# Patient Record
Sex: Male | Born: 2005 | Race: Black or African American | Hispanic: No | Marital: Single | Smoking: Never smoker
Health system: Southern US, Community
[De-identification: ages and names within clinical notes are randomized; demographics above are authoritative.]

## PROBLEM LIST (undated history)

## (undated) HISTORY — PX: MYRINGOTOMY WITH TUBE PLACEMENT: SHX5663

---
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
## 2024-04-05 NOTE — Progress Notes (Signed)
 " [address] - AMBULATORY [facility] [address] [place] [address]   Date of Service: 04/05/2024 Patient Name: Isaac Blankenship Patient DOB: Dec 27, 2005   Well Adolescent Visit Subjective:  Isaac Blankenship is a 18 y.o. male who presents for his routine well visit. Chief Complaint  Patient presents with   Well Child    Pt presents for 41yr WCC. No concerns    Well Child Assessment: History provided by: Patient. Isaac Blankenship lives with his mother, father, brother and sister.  Nutrition Types of intake include cereals, cow's milk, eggs, fish, fruits, meats, vegetables and juices.  Dental The patient has a dental home. The patient brushes teeth regularly. The patient flosses regularly. Last dental exam was less than 6 months ago.  Elimination Elimination problems do not include constipation.  Sleep Average sleep duration is 7 hours. The patient does not snore. There are no sleep problems.  Safety There is no smoking in the home. Home has working smoke alarms? yes. Home has working carbon monoxide alarms? yes. There is no gun in home.  School Current grade level is 12th. Current school district is [employer]. There are no signs of learning disabilities. Child is doing well in school.  Social After school, the child is at home with a parent. Sibling interactions are good. The child spends 5 hours in front of a screen (tv or computer) per day.    Tobacco Use: No tobacco use   Substance Use: No previous substance abuse history.  Sexual History: The patient denies current or previous sexual activity.  Behavior/Mood: Depression Plan: Normal/Negative Screening  Comments: Doing well Film and photography interest; [employer] 5 year program will graduate next year with an Associates degree   Past medical history, family history, medications, allergies reviewed and reconciled as  appropriate. Objective:  BP 120/79 (BP Location: Left arm, Patient Position: Sitting)   Pulse 71   Temp 98 F (36.7 C) (Oral)   Resp 18   Ht 1.638 m (5' 4.5)   Wt 51.9 kg (114 lb 8 oz)   BMI 19.35 kg/m   Physical Exam Vitals reviewed.  Constitutional:      General: He is not in acute distress.    Appearance: Normal appearance. He is well-developed.  HENT:     Head: Atraumatic.     Right Ear: Tympanic membrane, ear canal and external ear normal.     Left Ear: Tympanic membrane, ear canal and external ear normal.     Nose: Nose normal.     Mouth/Throat:     Mouth: Mucous membranes are moist.     Pharynx: Oropharynx is clear.  Eyes:     Extraocular Movements: Extraocular movements intact.     Conjunctiva/sclera: Conjunctivae normal.     Pupils: Pupils are equal, round, and reactive to light.  Cardiovascular:     Rate and Rhythm: Normal rate and regular rhythm.     Pulses: Normal pulses.     Heart sounds: Normal heart sounds.  Pulmonary:     Effort: Pulmonary effort is normal.     Breath sounds: Normal breath sounds.  Abdominal:     General: Abdomen is flat. Bowel sounds are normal. There is no distension.     Palpations: Abdomen is soft.     Tenderness: There is no abdominal tenderness.  Genitourinary:    Penis: Normal.      Testes: Normal.     Comments:  Circ, no hernia palpated, Tanner IV Musculoskeletal:        General: Normal range of motion.     Cervical back: Normal range of motion and neck supple.     Comments: No spinal curvature appreciated.  Skin:    Capillary Refill: Capillary refill takes less than 2 seconds.  Neurological:     General: No focal deficit present.     Mental Status: He is alert and oriented to person, place, and time.     Sensory: Sensation is intact.     Motor: Motor function is intact.     Gait: Gait is intact.  Psychiatric:        Mood and Affect: Mood and affect normal.        Speech: Speech normal.        Behavior: Behavior  normal. Behavior is cooperative.    Hearing Screening   500Hz  1000Hz  2000Hz  4000Hz   Right ear 20 20 20 20   Left ear 20 20 20 20    Vision Screening   Right eye Left eye Both eyes  Without correction 20/16 20/16   With correction        No results found for this visit on 04/05/24.  PHQ Depression Screening: Most recent PHQ-9 result: Patient Health Questionnaire-9 Score: 2 (04/05/24 1039) PHQ-9 Question # 9 Thoughts that you would be better off dead or hurting yourself in some way: Not at all (04/05/24 1039) Interpretation: PHQ-2 Interpretation: Negative (None-minimal Depression Severity) (04/05/24 1039) PHQ-9 Interpretation: Negative (None-minimal Depression Severity) (04/05/24 1039)  CRAFFT Screening: CRAFFT Screening Tool for Adolescent Substance Abuse Part A:  During the PAST 12 MONTHS, on how many days did you: 1. Drink more than a few sips of beer, wine, or any drink containing alcohol? Say 0 if none: 0 2. Use any marijuana (cannabis, weed, oil, wax, or has by smoking, vaping dabbing, or in edibles) or synthetic marijuana (like K2, Spice)?  Say 0 if none: 0 3. Use anything else to get high (like other illegal drugs, pills, prescription or over-the-counter medications, and things that you sniff, huff, vape, or inject)?  Say 0 if none: 0 4. Use a vaping device* containing nicotine and/or flavors, or use any tobacco products**?  Say 0 if none: 0 Part B: CRAFFT C - Have you ever ridden in a CAR driven by someone (including yourself) who was high or had been using alcohol or drugs? : No  Assessment/Plan:  Diagnoses and all orders for this visit:  Routine general medical examination at a health care facility  Mild intermittent asthma, uncomplicated (CMD) -     albuterol  HFA (Ventolin  HFA) 90 mcg/actuation inhaler; Inhale 2 puffs every 4 (four) hours as needed for wheezing.  Seasonal allergic rhinitis due to pollen -     cetirizine (ZyrTEC) 10 mg tablet; Take 1  tablet (10 mg total) by mouth daily. -     fluticasone propionate (FLONASE) 50 mcg/spray nasal spray; Administer 1 spray into each nostril daily.   Congratulations on completing your high school courses and putting forth the extra effort to complete your Associates degree next year.  It is time to find an adult doctor.  You can continue to be seen here through the summer but by fall you should have a new provider to help with any health concerns.   Discuss the Men B vaccine with your parents, if you want to start the 2 part series shot this summer call the office for a nurse visit.  You will  need a second shot in 6 months.   Discussed healthy nutrition, development, and safety. Reviewed results/ questionnaire and discussed next steps.  Anticipatory guidance:   Physical growth and development- balanced diet, physical activity, limit TV, intact hearing, brush/floss teeth, regular dentist visits   Social and academic competence-age-appropriate limits, friends/relationships, family time, community involvement, encourage reading/school, rules/expectations, planning for after high school   Emotional well-being- dealing with stress, decision making, mood changes, sexuality/puberty   Risk reduction- tobacco, alcohol, drugs, prescription drugs, sex   Violence and injury prevention- seatbelts, guns, conflict resolution, driving restriction, sports/recreation safety   Laboratory/screening results:  Results reviewed and discussed next steps  Immunizations:   Discussed AAP recommended vaccine schedule   Counseled caretaker regarding vaccines needed today and/or in the future   See vaccine administration record  Follow-up:   As needed  Sherran Nat Shutter, DO  "

## 2024-10-28 ENCOUNTER — Encounter (HOSPITAL_COMMUNITY): Payer: Self-pay | Admitting: Emergency Medicine

## 2024-10-28 ENCOUNTER — Ambulatory Visit (HOSPITAL_COMMUNITY)
Admission: EM | Admit: 2024-10-28 | Discharge: 2024-10-28 | Disposition: A | Discharge status: Home / Self Care | Attending: Neurology | Admitting: Neurology

## 2024-10-28 DIAGNOSIS — Z711 Person with feared health complaint in whom no diagnosis is made: Secondary | ICD-10-CM | POA: Diagnosis not present

## 2024-10-28 LAB — HIV ANTIBODY (ROUTINE TESTING W REFLEX): HIV Screen 4th Generation wRfx: NONREACTIVE

## 2024-10-28 NOTE — Discharge Instructions (Addendum)
 We will follow up with you on your STD testing results in the next 2-3 days and send appropriate antibiotics to your pharmacy.  If your pain does not resolve with treatment or worsens please follow up with PCP or Urology listed on AVS.  If you develop severe pain, pain in your testicles, or swelling please go to the emergency department.

## 2024-10-28 NOTE — ED Provider Notes (Signed)
 " [facility]    CSN: [id number] Arrival date & time: 10/28/24  1357      History   Chief Complaint Chief Complaint  Patient presents with   Penis Pain    HPI Isaac Blankenship is a 18 y.o. male.   Isaac Blankenship is a 18 y.o. presenting with intermittent pain in his penis spontaneously for the last two days. He does endorse unprotected sex last Thursday and Friday. He does not know if he has been exposed to STDs. He endorse pain in the middle of his shaft spontaneously. It is not worse when he urinates. He has not had intercourse since this started. No change in pain with palpation. No open lesions. He would like STD testing including HIV/RPR.   The history is provided by the patient.  Penis Pain This is a new problem. The current episode started 2 days ago. The problem occurs daily. The problem has not changed since onset.Pertinent negatives include no chest pain, no abdominal pain, no headaches and no shortness of breath. Nothing aggravates the symptoms. Nothing relieves the symptoms. He has tried nothing for the symptoms.    Past Medical History:  Diagnosis Date   Asthma     There are no active problems to display for this patient.   Past Surgical History:  Procedure Laterality Date   MYRINGOTOMY WITH TUBE PLACEMENT         Home Medications    Prior to Admission medications  Medication Sig Start Date End Date Taking? Authorizing Provider  acetaminophen  (TYLENOL ) 160 MG/5ML solution Take 320 mg by mouth every 6 (six) hours as needed for pain.    [provider]  albuterol  (PROVENTIL  HFA;VENTOLIN  HFA) 108 (90 BASE) MCG/ACT inhaler Inhale 2 puffs into the lungs every 6 (six) hours as needed. For wheezing.    [provider]  beclomethasone (QVAR) 40 MCG/ACT inhaler Inhale 2 puffs into the lungs 2 (two) times daily.    [provider]  fluticasone (FLONASE) 50 MCG/ACT nasal spray Place 2 sprays into the nose daily as needed for  rhinitis or allergies.    [provider]  loratadine (CLARITIN) 5 MG chewable tablet Chew 5 mg by mouth daily.    [provider]  montelukast (SINGULAIR) 5 MG chewable tablet Chew 5 mg by mouth daily.     [provider]    Family History Family History  Problem Relation Age of Onset   Heart murmur Mother     Social History Social History[1]   Allergies   Patient has no known allergies.   Review of Systems Review of Systems  Respiratory:  Negative for shortness of breath.   Cardiovascular:  Negative for chest pain.  Gastrointestinal:  Negative for abdominal pain.  Genitourinary:  Positive for penile pain.  Neurological:  Negative for headaches.     Physical Exam Triage Vital Signs ED Triage Vitals [10/28/24 1431]  Encounter Vitals Group     BP 115/75     Girls Systolic BP Percentile      Girls Diastolic BP Percentile      Boys Systolic BP Percentile      Boys Diastolic BP Percentile      Pulse Rate 66     Resp 13     Temp 97.8 F (36.6 C)     Temp Source Oral     SpO2 98 %     Weight      Height      Head Circumference  Peak Flow      Pain Score 0     Pain Loc      Pain Education      Exclude from Growth Chart    No data found.  Updated Vital Signs BP 115/75 (BP Location: Right Arm)   Pulse 66   Temp 97.8 F (36.6 C) (Oral)   Resp 13   SpO2 98%   Visual Acuity Right Eye Distance:   Left Eye Distance:   Bilateral Distance:    Right Eye Near:   Left Eye Near:    Bilateral Near:     Physical Exam Vitals and nursing note reviewed. Exam conducted with a chaperone present.  Constitutional:      General: He is not in acute distress.    Appearance: He is well-developed.  HENT:     Head: Normocephalic and atraumatic.  Eyes:     Conjunctiva/sclera: Conjunctivae normal.  Cardiovascular:     Rate and Rhythm: Normal rate and regular rhythm.     Heart sounds: No murmur heard. Pulmonary:     Effort: Pulmonary  effort is normal. No respiratory distress.     Breath sounds: Normal breath sounds.  Abdominal:     Palpations: Abdomen is soft.     Tenderness: There is no abdominal tenderness.  Genitourinary:    Pubic Area: No rash.      Penis: Normal and circumcised. No tenderness, discharge, swelling or lesions.      Testes: Normal.  Musculoskeletal:        General: No swelling.     Cervical back: Neck supple.  Skin:    General: Skin is warm and dry.     Capillary Refill: Capillary refill takes less than 2 seconds.  Neurological:     Mental Status: He is alert.  Psychiatric:        Mood and Affect: Mood normal.      UC Treatments / Results  Labs (all labs ordered are listed, but only abnormal results are displayed) Labs Reviewed  HIV ANTIBODY (ROUTINE TESTING W REFLEX)  SYPHILIS: RPR W/REFLEX TO RPR TITER AND TREPONEMAL ANTIBODIES, TRADITIONAL SCREENING AND DIAGNOSIS ALGORITHM  CYTOLOGY, (ORAL, ANAL, URETHRAL) ANCILLARY ONLY    EKG   Radiology No results found.  Procedures Procedures (including critical care time)  Medications Ordered in UC Medications - No data to display  Initial Impression / Assessment and Plan / UC Course  I have reviewed the triage vital signs and the nursing notes.  Pertinent labs & imaging results that were available during my care of the patient were reviewed by me and considered in my medical decision making (see chart for details).  Patient tested for STDs including HIV and syphilis. No lesion, mass, or pain with palpation on exam. No ppx antibiotics sent at this time. Return precautions given and patient in agreement.   Final Clinical Impressions(s) / UC Diagnoses   Final diagnoses:  Concern about STD in male without diagnosis     Discharge Instructions      We will follow up with you on your STD testing results in the next 2-3 days and send appropriate antibiotics to your pharmacy.  If your pain does not resolve with treatment or worsens  please follow up with PCP or Urology listed on AVS.  If you develop severe pain, pain in your testicles, or swelling please go to the emergency department.       ED Prescriptions   None    PDMP not reviewed this  encounter.     [1]  Social History Tobacco Use   Smoking status: Never    Passive exposure: Never   Smokeless tobacco: Never  Vaping Use   Vaping status: Never Used  Substance Use Topics   Alcohol use: No   Drug use: No     Remi Pippin, NP 10/28/24 1515  "

## 2024-10-28 NOTE — ED Triage Notes (Signed)
 Pt reports will have intermittent penis pains over the past 2 days. Denies urinary or discharge or known STD exposure.

## 2024-10-29 LAB — SYPHILIS: RPR W/REFLEX TO RPR TITER AND TREPONEMAL ANTIBODIES, TRADITIONAL SCREENING AND DIAGNOSIS ALGORITHM: RPR Ser Ql: NONREACTIVE

## 2024-10-30 LAB — CYTOLOGY, (ORAL, ANAL, URETHRAL) ANCILLARY ONLY
Chlamydia: NEGATIVE
Comment: NEGATIVE
Comment: NEGATIVE
Comment: NORMAL
Neisseria Gonorrhea: NEGATIVE
Trichomonas: NEGATIVE
